# Patient Record
Sex: Female | Born: 1990 | Race: Black or African American | Hispanic: No | Marital: Single | Smoking: Current every day smoker
Health system: Southern US, Community
[De-identification: ages and names within clinical notes are randomized; demographics above are authoritative.]

## PROBLEM LIST (undated history)

## (undated) ENCOUNTER — Inpatient Hospital Stay (HOSPITAL_COMMUNITY): Payer: Self-pay

## (undated) DIAGNOSIS — J45909 Unspecified asthma, uncomplicated: Secondary | ICD-10-CM

## (undated) DIAGNOSIS — D649 Anemia, unspecified: Secondary | ICD-10-CM

## (undated) HISTORY — PX: WISDOM TOOTH EXTRACTION: SHX21

---

## 2011-03-08 ENCOUNTER — Emergency Department (HOSPITAL_COMMUNITY): Payer: BC Managed Care – PPO

## 2011-03-08 ENCOUNTER — Emergency Department (HOSPITAL_COMMUNITY): Payer: No Typology Code available for payment source

## 2011-03-08 ENCOUNTER — Emergency Department (HOSPITAL_COMMUNITY)
Admission: EM | Admit: 2011-03-08 | Discharge: 2011-03-08 | Disposition: A | Discharge status: Home / Self Care | Payer: BC Managed Care – PPO | Attending: Emergency Medicine | Admitting: Emergency Medicine

## 2011-03-08 ENCOUNTER — Emergency Department (HOSPITAL_COMMUNITY)
Admission: EM | Admit: 2011-03-08 | Discharge: 2011-03-09 | Disposition: A | Discharge status: Home / Self Care | Payer: No Typology Code available for payment source | Attending: Emergency Medicine | Admitting: Emergency Medicine

## 2011-03-08 DIAGNOSIS — R509 Fever, unspecified: Secondary | ICD-10-CM | POA: Insufficient documentation

## 2011-03-08 DIAGNOSIS — R079 Chest pain, unspecified: Secondary | ICD-10-CM | POA: Insufficient documentation

## 2011-03-08 DIAGNOSIS — M542 Cervicalgia: Secondary | ICD-10-CM | POA: Insufficient documentation

## 2011-03-08 DIAGNOSIS — R072 Precordial pain: Secondary | ICD-10-CM | POA: Insufficient documentation

## 2011-03-08 DIAGNOSIS — R630 Anorexia: Secondary | ICD-10-CM | POA: Insufficient documentation

## 2011-03-08 DIAGNOSIS — S139XXA Sprain of joints and ligaments of unspecified parts of neck, initial encounter: Secondary | ICD-10-CM | POA: Insufficient documentation

## 2011-03-08 DIAGNOSIS — J45909 Unspecified asthma, uncomplicated: Secondary | ICD-10-CM | POA: Insufficient documentation

## 2011-03-08 DIAGNOSIS — R197 Diarrhea, unspecified: Secondary | ICD-10-CM | POA: Insufficient documentation

## 2011-03-08 DIAGNOSIS — R109 Unspecified abdominal pain: Secondary | ICD-10-CM | POA: Insufficient documentation

## 2011-03-08 DIAGNOSIS — R10819 Abdominal tenderness, unspecified site: Secondary | ICD-10-CM | POA: Insufficient documentation

## 2011-03-08 DIAGNOSIS — N12 Tubulo-interstitial nephritis, not specified as acute or chronic: Secondary | ICD-10-CM | POA: Insufficient documentation

## 2011-03-08 DIAGNOSIS — R110 Nausea: Secondary | ICD-10-CM | POA: Insufficient documentation

## 2011-03-08 LAB — DIFFERENTIAL
Basophils Relative: 0 % (ref 0–1)
Eosinophils Relative: 0 % (ref 0–5)
Lymphocytes Relative: 7 % — ABNORMAL LOW (ref 12–46)
Monocytes Absolute: 0.3 10*3/uL (ref 0.1–1.0)
Monocytes Relative: 6 % (ref 3–12)
Neutro Abs: 4.4 10*3/uL (ref 1.7–7.7)

## 2011-03-08 LAB — COMPREHENSIVE METABOLIC PANEL
BUN: 6 mg/dL (ref 6–23)
CO2: 22 mEq/L (ref 19–32)
Chloride: 110 mEq/L (ref 96–112)
Creatinine, Ser: 0.71 mg/dL (ref 0.4–1.2)
GFR calc non Af Amer: >60 mL/min (ref >60)
Glucose, Bld: 125 mg/dL — ABNORMAL HIGH (ref 70–99)
Total Bilirubin: 0.5 mg/dL (ref 0.3–1.2)

## 2011-03-08 LAB — CBC
HCT: 36.8 % (ref 36.0–46.0)
Hemoglobin: 12.5 g/dL (ref 12.0–15.0)
MCH: 28.2 pg (ref 26.0–34.0)
MCHC: 34 g/dL (ref 30.0–36.0)

## 2011-03-08 LAB — URINALYSIS, ROUTINE W REFLEX MICROSCOPIC
Hgb urine dipstick: NEGATIVE
Protein, ur: 30 mg/dL — AB
Urobilinogen, UA: 1 mg/dL (ref 0.0–1.0)

## 2011-03-08 LAB — URINE MICROSCOPIC-ADD ON

## 2011-03-08 LAB — LIPASE, BLOOD: Lipase: 18 U/L (ref 11–59)

## 2011-03-08 LAB — POCT I-STAT, CHEM 8
Calcium, Ion: 1.11 mmol/L — ABNORMAL LOW (ref 1.12–1.32)
Creatinine, Ser: 0.8 mg/dL (ref 0.4–1.2)
Glucose, Bld: 125 mg/dL — ABNORMAL HIGH (ref 70–99)
Hemoglobin: 13.3 g/dL (ref 12.0–15.0)
Potassium: 3.3 mEq/L — ABNORMAL LOW (ref 3.5–5.1)

## 2011-03-09 ENCOUNTER — Emergency Department (HOSPITAL_COMMUNITY): Payer: No Typology Code available for payment source

## 2013-03-27 ENCOUNTER — Other Ambulatory Visit: Payer: Self-pay

## 2013-03-27 DIAGNOSIS — S93326A Dislocation of tarsometatarsal joint of unspecified foot, initial encounter: Secondary | ICD-10-CM

## 2013-03-27 DIAGNOSIS — M8448XA Pathological fracture, other site, initial encounter for fracture: Secondary | ICD-10-CM

## 2013-03-28 ENCOUNTER — Ambulatory Visit
Admission: RE | Admit: 2013-03-28 | Discharge: 2013-03-28 | Disposition: A | Discharge status: Home / Self Care | Payer: BC Managed Care – PPO | Source: Ambulatory Visit

## 2013-03-28 DIAGNOSIS — M8448XA Pathological fracture, other site, initial encounter for fracture: Secondary | ICD-10-CM

## 2013-03-28 DIAGNOSIS — S93326A Dislocation of tarsometatarsal joint of unspecified foot, initial encounter: Secondary | ICD-10-CM

## 2013-04-04 ENCOUNTER — Encounter: Payer: Self-pay | Admitting: Obstetrics and Gynecology

## 2013-09-09 ENCOUNTER — Encounter (HOSPITAL_COMMUNITY): Payer: Self-pay | Admitting: *Deleted

## 2013-09-09 ENCOUNTER — Emergency Department (HOSPITAL_COMMUNITY)
Admission: EM | Admit: 2013-09-09 | Discharge: 2013-09-09 | Disposition: A | Discharge status: Home / Self Care | Payer: No Typology Code available for payment source | Attending: Emergency Medicine | Admitting: Emergency Medicine

## 2013-09-09 ENCOUNTER — Emergency Department (HOSPITAL_COMMUNITY): Payer: BC Managed Care – PPO

## 2013-09-09 DIAGNOSIS — Z880 Allergy status to penicillin: Secondary | ICD-10-CM | POA: Insufficient documentation

## 2013-09-09 DIAGNOSIS — F17200 Nicotine dependence, unspecified, uncomplicated: Secondary | ICD-10-CM | POA: Insufficient documentation

## 2013-09-09 DIAGNOSIS — R6889 Other general symptoms and signs: Secondary | ICD-10-CM | POA: Insufficient documentation

## 2013-09-09 DIAGNOSIS — J45909 Unspecified asthma, uncomplicated: Secondary | ICD-10-CM | POA: Insufficient documentation

## 2013-09-09 DIAGNOSIS — Z791 Long term (current) use of non-steroidal anti-inflammatories (NSAID): Secondary | ICD-10-CM | POA: Insufficient documentation

## 2013-09-09 DIAGNOSIS — R5381 Other malaise: Secondary | ICD-10-CM | POA: Insufficient documentation

## 2013-09-09 DIAGNOSIS — M940 Chondrocostal junction syndrome [Tietze]: Secondary | ICD-10-CM | POA: Insufficient documentation

## 2013-09-09 DIAGNOSIS — R5383 Other fatigue: Secondary | ICD-10-CM

## 2013-09-09 HISTORY — DX: Unspecified asthma, uncomplicated: J45.909

## 2013-09-09 MED ORDER — NAPROXEN 500 MG PO TABS: 500.0000 mg | ORAL_TABLET | Freq: Two times a day (BID) | ORAL | Status: DC

## 2013-09-09 MED ORDER — NAPROXEN 250 MG PO TABS
500.0000 mg | ORAL_TABLET | Freq: Once | ORAL | Status: AC
  Administered 2013-09-09: 500 mg via ORAL
  Filled 2013-09-09: qty 2

## 2013-09-09 NOTE — ED Provider Notes (Signed)
CSN: [id number]     Arrival date & time 09/09/13  0255 History   First MD Initiated Contact with Patient 09/09/13 [phone]     Chief Complaint  Patient presents with  . chest and throat tightness    (Consider location/radiation/quality/duration/timing/severity/associated sxs/prior Treatment) HPI 22 year old female presents to emergency department with complaint of chest tightness and anterior chest pain, throat, tightness tonight, and generalized fatigue.  He reports fatigue, ongoing for the last week.  She reports that she works third shift, and is sleeping well during the day, but when she tries to stay awake at night.  She feels very fatigued.  Around 1:30 AM she had tightness in her throat.  She's had this in the past, and thought it maybe do to a food allergy.  She took 2 Benadryl, which has made an upper tightness, better.  She reports the chest tightness, and anterior chest pain.  Has not improved.  She denies any trauma or new activities that may trigger the pain.  No leg swelling, shortness of breath, pleuritic type chest pain, prolonged immobilization or birth control use. Past Medical History  Diagnosis Date  . Asthma    History reviewed. No pertinent past surgical history. No family history on file. History  Substance Use Topics  . Smoking status: Current Every Day Smoker  . Smokeless tobacco: Not on file  . Alcohol Use: Yes   OB History   Grav Para Term Preterm Abortions TAB SAB Ect Mult Living                 Review of Systems  All other systems reviewed and are negative.    Allergies  Penicillins  Home Medications   Current Outpatient Rx  Name  Route  Sig  Dispense  Refill  . naproxen (NAPROSYN) 500 MG tablet   Oral   Take 1 tablet (500 mg total) by mouth 2 (two) times daily with a meal.   30 tablet   0    BP 119/59  Pulse 81  Temp(Src) 98.2 F (36.8 C) (Oral)  Resp 16  SpO2 100% Physical Exam  Nursing note and vitals reviewed. Constitutional: She is  oriented to person, place, and time. She appears well-developed and well-nourished.  HENT:  Head: Normocephalic and atraumatic.  Right Ear: External ear normal.  Left Ear: External ear normal.  Nose: Nose normal.  Mouth/Throat: Oropharynx is clear and moist.  Eyes: Conjunctivae and EOM are normal. Pupils are equal, round, and reactive to light.  Neck: Normal range of motion. Neck supple. No JVD present. No tracheal deviation present. No thyromegaly present.  Cardiovascular: Normal rate, regular rhythm, normal heart sounds and intact distal pulses.  Exam reveals no gallop and no friction rub.   No murmur heard. Pulmonary/Chest: Effort normal and breath sounds normal. No stridor. No respiratory distress. She has no wheezes. She has no rales. She exhibits tenderness (patient with tenderness to her anterior sternum reproduces her pain).  Abdominal: Soft. Bowel sounds are normal. She exhibits no distension and no mass. There is no tenderness. There is no rebound and no guarding.  Musculoskeletal: Normal range of motion. She exhibits no edema and no tenderness.  Lymphadenopathy:    She has no cervical adenopathy.  Neurological: She is alert and oriented to person, place, and time. She has normal reflexes. No cranial nerve deficit. She exhibits normal muscle tone. Coordination normal.  Skin: Skin is warm and dry. No rash noted. No erythema. No pallor.  Psychiatric: She has a  normal mood and affect. Her behavior is normal. Judgment and thought content normal.    ED Course  Procedures (including critical care time) Labs Review Labs Reviewed - No data to display Imaging Review Dg Chest 2 View  09/09/2013   CLINICAL DATA:  Chest tightness  EXAM: CHEST  2 VIEW  COMPARISON:  03/08/2011  FINDINGS: The heart size and mediastinal contours are within normal limits. Both lungs are clear. The visualized skeletal structures are unremarkable. No pneumothorax.  IMPRESSION: No active cardiopulmonary disease.    Electronically Signed   By: Oley Balm M.D.   On: 09/09/2013 04:49    Date: 09/09/2013  Rate: 77  Rhythm: normal sinus rhythm  QRS Axis: normal  Intervals: normal  ST/T Wave abnormalities: normal  Conduction Disutrbances:none  Narrative Interpretation:   Old EKG Reviewed: unchanged    MDM   1. Costochondritis, acute   2. Fatigue    86-year-old female with anterior chest wall pain, throat, tightness, and fatigue.  She reports family history of hypothyroidism, but has not been diagnosed herself.  Workup here shows probable costochondritis.  She's been encouraged to find a local primary care Dr. for further workup of her ongoing fatigue.    Olivia Mackie, MD 09/09/13 [phone]

## 2013-09-09 NOTE — ED Notes (Signed)
Patient transported to X-ray 

## 2013-09-09 NOTE — ED Notes (Signed)
The pt is c/o chest and throat tightness since 0130a.  She has a history of the same,  She took a benadryl and the sensation may have  Become better

## 2014-02-04 ENCOUNTER — Encounter (HOSPITAL_BASED_OUTPATIENT_CLINIC_OR_DEPARTMENT_OTHER): Payer: Self-pay | Admitting: Emergency Medicine

## 2014-02-04 ENCOUNTER — Emergency Department (HOSPITAL_BASED_OUTPATIENT_CLINIC_OR_DEPARTMENT_OTHER)
Admission: EM | Admit: 2014-02-04 | Discharge: 2014-02-04 | Disposition: A | Discharge status: Home / Self Care | Payer: No Typology Code available for payment source | Attending: Emergency Medicine | Admitting: Emergency Medicine

## 2014-02-04 DIAGNOSIS — Z791 Long term (current) use of non-steroidal anti-inflammatories (NSAID): Secondary | ICD-10-CM | POA: Insufficient documentation

## 2014-02-04 DIAGNOSIS — Z880 Allergy status to penicillin: Secondary | ICD-10-CM | POA: Insufficient documentation

## 2014-02-04 DIAGNOSIS — J45909 Unspecified asthma, uncomplicated: Secondary | ICD-10-CM | POA: Insufficient documentation

## 2014-02-04 DIAGNOSIS — F17200 Nicotine dependence, unspecified, uncomplicated: Secondary | ICD-10-CM | POA: Insufficient documentation

## 2014-02-04 DIAGNOSIS — J039 Acute tonsillitis, unspecified: Secondary | ICD-10-CM | POA: Insufficient documentation

## 2014-02-04 LAB — RAPID STREP SCREEN (MED CTR MEBANE ONLY): STREPTOCOCCUS, GROUP A SCREEN (DIRECT): NEGATIVE

## 2014-02-04 MED ORDER — FLUCONAZOLE 150 MG PO TABS: 150.0000 mg | ORAL_TABLET | Freq: Every day | ORAL | Status: DC

## 2014-02-04 MED ORDER — CLINDAMYCIN HCL 300 MG PO CAPS: 300.0000 mg | ORAL_CAPSULE | Freq: Four times a day (QID) | ORAL | Status: DC

## 2014-02-04 NOTE — ED Provider Notes (Addendum)
CSN: [id number]     Arrival date & time 02/04/14  1411 History   First MD Initiated Contact with Patient 02/04/14 1440     Chief Complaint  Patient presents with  . Sore Throat     (Consider location/radiation/quality/duration/timing/severity/associated sxs/prior Treatment) HPI Comments: Patient is a 23 year old female presents with sore throat for the past 2 days. She noticed some white spots in her throat and this concerned her. She denies any ill contacts.  Patient is a 23 y.o. female presenting with pharyngitis. The history is provided by the patient.  Sore Throat This is a new problem. The current episode started 2 days ago. The problem occurs constantly. The problem has been gradually worsening. Pertinent negatives include no abdominal pain, no headaches and no shortness of breath. The symptoms are aggravated by swallowing. Nothing relieves the symptoms. She has tried nothing for the symptoms. The treatment provided no relief.    Past Medical History  Diagnosis Date  . Asthma    History reviewed. No pertinent past surgical history. No family history on file. History  Substance Use Topics  . Smoking status: Current Every Day Smoker  . Smokeless tobacco: Not on file  . Alcohol Use: Yes   OB History   Grav Para Term Preterm Abortions TAB SAB Ect Mult Living                 Review of Systems  Respiratory: Negative for shortness of breath.   Gastrointestinal: Negative for abdominal pain.  Neurological: Negative for headaches.  All other systems reviewed and are negative.      Allergies  Penicillins and Sulfa antibiotics  Home Medications   Current Outpatient Rx  Name  Route  Sig  Dispense  Refill  . naproxen (NAPROSYN) 500 MG tablet   Oral   Take 1 tablet (500 mg total) by mouth 2 (two) times daily with a meal.   30 tablet   0    BP 113/69  Pulse 100  Temp(Src) 100.8 F (38.2 C) (Oral)  Resp 18  Ht 5\' 8"  (1.727 m)  Wt 180 lb (81.647 kg)  BMI 27.38  kg/m2  SpO2 100% Physical Exam  Nursing note and vitals reviewed. Constitutional: She is oriented to person, place, and time. She appears well-developed and well-nourished. No distress.  HENT:  Head: Normocephalic and atraumatic.  Mouth/Throat: Oropharynx is clear and moist.  The posterior oropharynx is erythematous with exudates present. There is no asymmetry or tonsillar deviation.  Neck: Normal range of motion. Neck supple.  Cardiovascular: Normal rate and regular rhythm.   Pulmonary/Chest: Effort normal and breath sounds normal. No respiratory distress.  Musculoskeletal: Normal range of motion. She exhibits no edema.  Lymphadenopathy:    She has no cervical adenopathy.  Neurological: She is alert and oriented to person, place, and time.  Skin: Skin is warm and dry. She is not diaphoretic.    ED Course  Procedures (including critical care time) Labs Review Labs Reviewed  RAPID STREP SCREEN   Imaging Review No results found.    MDM   Final diagnoses:  None    Strep test negative.  Will treat for tonsillitis with clindamycin.  Return prn.    Geoffery Lyonsouglas Amro Winebarger, MD 02/04/14 1511  Geoffery Lyonsouglas Erial Fikes, MD 02/04/14 [phone]

## 2014-02-04 NOTE — ED Notes (Signed)
Pt. Reports she has a sore throat and cough.  Pt. Reports aching all over.

## 2014-02-04 NOTE — Discharge Instructions (Signed)
Clindamycin as prescribed.    Return to the ER for difficulty breathing or swallowing.     Tonsillitis Tonsillitis is an infection of the throat that causes the tonsils to become red, tender, and swollen. Tonsils are collections of lymphoid tissue at the back of the throat. Each tonsil has crevices (crypts). Tonsils help fight nose and throat infections and keep infection from spreading to other parts of the body for the first 18 months of life.  CAUSES Sudden (acute) tonsillitis is usually caused by infection with streptococcal bacteria. Long-lasting (chronic) tonsillitis occurs when the crypts of the tonsils become filled with pieces of food and bacteria, which makes it easy for the tonsils to become repeatedly infected. SYMPTOMS  Symptoms of tonsillitis include:  A sore throat, with possible difficulty swallowing.  White patches on the tonsils.  Fever.  Tiredness.  New episodes of snoring during sleep, when you did not snore before.  Small, foul-smelling, yellowish-white pieces of material (tonsilloliths) that you occasionally cough up or spit out. The tonsilloliths can also cause you to have bad breath. DIAGNOSIS Tonsillitis can be diagnosed through a physical exam. Diagnosis can be confirmed with the results of lab tests, including a throat culture. TREATMENT  The goals of tonsillitis treatment include the reduction of the severity and duration of symptoms and prevention of associated conditions. Symptoms of tonsillitis can be improved with the use of steroids to reduce the swelling. Tonsillitis caused by bacteria can be treated with antibiotics. Usually, treatment with antibiotics is started before the cause of the tonsillitis is known. However, if it is determined that the cause is not bacterial, antibiotics will not treat the tonsillitis. If attacks of tonsillitis are severe and frequent, your caregiver may recommend surgery to remove the tonsils (tonsillectomy). HOME CARE  INSTRUCTIONS   Rest as much as possible and get plenty of sleep.  Drink plenty of fluids. While the throat is very sore, eat soft foods or liquids, such as sherbet, soups, or instant breakfast drinks.  Eat frozen ice pops.  Gargle with a warm or cold liquid to help soothe the throat. Mix 1/4 teaspoon of salt and 1/4 teaspoon of baking soda in in 8 oz of water. SEEK MEDICAL CARE IF:   Large, tender lumps develop in your neck.  A rash develops.  A green, yellow-brown, or bloody substance is coughed up.  You are unable to swallow liquids or food for 24 hours.  You notice that only one of the tonsils is swollen. SEEK IMMEDIATE MEDICAL CARE IF:   You develop any new symptoms such as vomiting, severe headache, stiff neck, chest pain, or trouble breathing or swallowing.  You have severe throat pain along with drooling or voice changes.  You have severe pain, unrelieved with recommended medications.  You are unable to fully open the mouth.  You develop redness, swelling, or severe pain anywhere in the neck.  You have a fever. MAKE SURE YOU:   Understand these instructions.  Will watch your condition.  Will get help right away if you are not doing well or get worse. Document Released: 09/15/2005 Document Revised: 08/08/2013 Document Reviewed: 05/25/2013 [facility]Exit[facility] Patient Information 2014 [place]Care, [place].

## 2014-02-06 LAB — CULTURE, GROUP A STREP

## 2015-10-22 ENCOUNTER — Encounter (HOSPITAL_COMMUNITY): Payer: Self-pay | Admitting: Emergency Medicine

## 2015-10-22 ENCOUNTER — Emergency Department (HOSPITAL_COMMUNITY): Payer: No Typology Code available for payment source

## 2015-10-22 ENCOUNTER — Emergency Department (HOSPITAL_COMMUNITY)
Admission: EM | Admit: 2015-10-22 | Discharge: 2015-10-22 | Disposition: A | Discharge status: Home / Self Care | Payer: No Typology Code available for payment source | Attending: Emergency Medicine | Admitting: Emergency Medicine

## 2015-10-22 ENCOUNTER — Ambulatory Visit: Payer: Self-pay

## 2015-10-22 DIAGNOSIS — Z79899 Other long term (current) drug therapy: Secondary | ICD-10-CM | POA: Insufficient documentation

## 2015-10-22 DIAGNOSIS — J45909 Unspecified asthma, uncomplicated: Secondary | ICD-10-CM | POA: Insufficient documentation

## 2015-10-22 DIAGNOSIS — N390 Urinary tract infection, site not specified: Secondary | ICD-10-CM | POA: Insufficient documentation

## 2015-10-22 DIAGNOSIS — Z720 Tobacco use: Secondary | ICD-10-CM | POA: Insufficient documentation

## 2015-10-22 DIAGNOSIS — Z113 Encounter for screening for infections with a predominantly sexual mode of transmission: Secondary | ICD-10-CM | POA: Insufficient documentation

## 2015-10-22 DIAGNOSIS — Z880 Allergy status to penicillin: Secondary | ICD-10-CM | POA: Insufficient documentation

## 2015-10-22 DIAGNOSIS — R05 Cough: Secondary | ICD-10-CM | POA: Insufficient documentation

## 2015-10-22 LAB — URINALYSIS, ROUTINE W REFLEX MICROSCOPIC
Bilirubin Urine: NEGATIVE
Glucose, UA: NEGATIVE mg/dL
Ketones, ur: NEGATIVE mg/dL
Nitrite: POSITIVE — AB
Protein, ur: NEGATIVE mg/dL
Specific Gravity, Urine: 1.02 (ref 1.005–1.030)
Urobilinogen, UA: 0.2 mg/dL (ref 0.0–1.0)
pH: 7.5 (ref 5.0–8.0)

## 2015-10-22 LAB — WET PREP, GENITAL
Clue Cells Wet Prep HPF POC: NONE SEEN
Trich, Wet Prep: NONE SEEN
Yeast Wet Prep HPF POC: NONE SEEN

## 2015-10-22 LAB — URINE MICROSCOPIC-ADD ON

## 2015-10-22 MED ORDER — KETOROLAC TROMETHAMINE 15 MG/ML IJ SOLN
15.0000 mg | Freq: Once | INTRAMUSCULAR | Status: AC
  Administered 2015-10-22: 15 mg via INTRAVENOUS
  Filled 2015-10-22: qty 1

## 2015-10-22 MED ORDER — GENTAMICIN SULFATE 40 MG/ML IJ SOLN
240.0000 mg | Freq: Once | INTRAMUSCULAR | Status: AC
  Administered 2015-10-22: 240 mg via INTRAMUSCULAR
  Filled 2015-10-22: qty 6

## 2015-10-22 MED ORDER — CIPROFLOXACIN HCL 500 MG PO TABS
500.0000 mg | ORAL_TABLET | Freq: Once | ORAL | Status: AC
  Administered 2015-10-22: 500 mg via ORAL
  Filled 2015-10-22: qty 1

## 2015-10-22 MED ORDER — AZITHROMYCIN 250 MG PO TABS
2000.0000 mg | ORAL_TABLET | Freq: Once | ORAL | Status: AC
  Administered 2015-10-22: 2000 mg via ORAL
  Filled 2015-10-22: qty 8

## 2015-10-22 MED ORDER — CIPROFLOXACIN HCL 500 MG PO TABS: 500.0000 mg | ORAL_TABLET | Freq: Two times a day (BID) | ORAL | Status: DC

## 2015-10-22 NOTE — ED Notes (Signed)
Pt states that she has had dysuria x 2 weeks with malodorous urine. Taking AZO at home. Also reports cough with chest congestion. Alert and oriented.

## 2015-10-22 NOTE — Discharge Instructions (Signed)
Urinary Tract Infection °Urinary tract infections (UTIs) can develop anywhere along your urinary tract. Your urinary tract is your body's drainage system for removing wastes and extra water. Your urinary tract includes two kidneys, two ureters, a bladder, and a urethra. Your kidneys are a pair of bean-shaped organs. Each kidney is about the size of your fist. They are located below your ribs, one on each side of your spine. °CAUSES °Infections are caused by microbes, which are microscopic organisms, including fungi, viruses, and bacteria. These organisms are so small that they can only be seen through a microscope. Bacteria are the microbes that most commonly cause UTIs. °SYMPTOMS  °Symptoms of UTIs may vary by age and gender of the patient and by the location of the infection. Symptoms in young women typically include a frequent and intense urge to urinate and a painful, burning feeling in the bladder or urethra during urination. Older women and men are more likely to be tired, shaky, and weak and have muscle aches and abdominal pain. A fever may mean the infection is in your kidneys. Other symptoms of a kidney infection include pain in your back or sides below the ribs, nausea, and vomiting. °DIAGNOSIS °To diagnose a UTI, your caregiver will ask you about your symptoms. Your caregiver will also ask you to provide a urine sample. The urine sample will be tested for bacteria and white blood cells. White blood cells are made by your body to help fight infection. °TREATMENT  °Typically, UTIs can be treated with medication. Because most UTIs are caused by a bacterial infection, they usually can be treated with the use of antibiotics. The choice of antibiotic and length of treatment depend on your symptoms and the type of bacteria causing your infection. °HOME CARE INSTRUCTIONS °· If you were prescribed antibiotics, take them exactly as your caregiver instructs you. Finish the medication even if you feel better after  you have only taken some of the medication. °· Drink enough water and fluids to keep your urine clear or pale yellow. °· Avoid caffeine, tea, and carbonated beverages. They tend to irritate your bladder. °· Empty your bladder often. Avoid holding urine for long periods of time. °· Empty your bladder before and after sexual intercourse. °· After a bowel movement, women should cleanse from front to back. Use each tissue only once. °SEEK MEDICAL CARE IF:  °· You have back pain. °· You develop a fever. °· Your symptoms do not begin to resolve within 3 days. °SEEK IMMEDIATE MEDICAL CARE IF:  °· You have severe back pain or lower abdominal pain. °· You develop chills. °· You have nausea or vomiting. °· You have continued burning or discomfort with urination. °MAKE SURE YOU:  °· Understand these instructions. °· Will watch your condition. °· Will get help right away if you are not doing well or get worse. °  °This information is not intended to replace advice given to you by your health care provider. Make sure you discuss any questions you have with your health care provider. °  °Document Released: 09/15/2005 Document Revised: 08/27/2015 Document Reviewed: 01/14/2012 °Elsevier Interactive Patient Education ©2016 Elsevier Inc. ° °Emergency Department Resource Guide °1) Find a Doctor and Pay Out of Pocket °Although you won't have to find out who is covered by your insurance plan, it is a good idea to ask around and get recommendations. You will then need to call the office and see if the doctor you have chosen will accept you as a new   patient and what types of options they offer for patients who are self-pay. Some doctors offer discounts or will set up payment plans for their patients who do not have insurance, but you will need to ask so you aren't surprised when you get to your appointment. ° °2) Contact Your Local Health Department °Not all health departments have doctors that can see patients for sick visits, but many  do, so it is worth a call to see if yours does. If you don't know where your local health department is, you can check in your phone book. The CDC also has a tool to help you locate your state's health department, and many state websites also have listings of all of their local health departments. ° °3) Find a Walk-in Clinic °If your illness is not likely to be very severe or complicated, you may want to try a walk in clinic. These are popping up all over the country in pharmacies, drugstores, and shopping centers. They're usually staffed by nurse practitioners or physician assistants that have been trained to treat common illnesses and complaints. They're usually fairly quick and inexpensive. However, if you have serious medical issues or chronic medical problems, these are probably not your best option. ° °No Primary Care Doctor: °- Call [facility] at  832-8000 - they can help you locate a primary care doctor that  accepts your insurance, provides certain services, etc. °- Physician Referral Service- 1-800-533-3463 ° °Chronic Pain Problems: °Organization         Address  Phone   Notes  °[facility]  (336) 297-2271 Patients need to be referred by their primary care doctor.  ° °Medication Assistance: °Organization         Address  Phone   Notes  °[place] [facility] [address] °[place], [place] [address] (336) 641-8030 --Must be a resident of [place] °-- Must have NO insurance coverage whatsoever (no Medicaid/ Medicare, etc.) °-- The pt. MUST have a primary care doctor that directs their care regularly and follows them in the community °  °[facility]  (866) 331-1348   °[employer]  (888) 892-1162   ° °Agencies that provide inexpensive medical care: °Organization         Address  Phone   Notes  °[facility]  (336) 832-8035   °[facility]    (336) 832-7272   °[facility] [address] °[place], [place] [address] (336) 832-4777   °[facility] [address], °[place] (336) 271-4999   °[facility]    (336) 373-0678   °[facility]    (336) 272-1050   °[facility] ° [address], [place] Phone:  (336) 832-4444, Fax:  (336) 832-4440 Hours of Operation:  9 am - 6 pm, M-F.  Also accepts Medicaid/Medicare and self-pay.  °[facility] ° [address], [place] Phone: (336) 832-3150, Fax: (336) 832-3151. Hours of Operation:  8:30 am - 5:30 pm, M-F.  Also accepts Medicaid and self-pay.  °[facility] [place] [address], [place] Phone: (336) 878-6027   °[facility] [address], [place], [place] (336)723-1848, Ext. 123 Mondays & Thursdays: 7-9 AM.  First 15 patients are seen on a first come, first serve basis. °  ° °Medicaid-accepting [place] Providers: ° °Organization         Address    Phone   Notes  °[facility] [address], [place] (336) 641-2100 Also accepts self-pay patients.  °[facility] [address], [place] ° (336) 856-9996   °[facility] [address] [place] [address], [place] (336) 288-8857   °[facility] [address] [place] [address], [place] (336) 299-7000   °Veita Bland [address], [place]  ° (336) 373-1557 Only accepts [place] Access Medicaid patients after they have their name applied to their card.  ° °Self-Pay (no insurance) in [place]: ° °Organization         Address  Phone   Notes  °Sickle Cell Patients, [facility] [address], [place] (336) 832-1970   °[facility] [address], [place] (336) 832-4400   °[facility] ° [address], [place] (336) 992-4800   °[facility]/Dr. Osei-Bonsu ° [address] [place] [address], [place] or  [address], [place] (336) 841-8500 Phone number for both [place] and [place] locations is the same.  °[facility] [address], [place] (336) 299-0000   °[facility] [address] [place] [address], [place] or [address] (336) 852-7530 °(336) 878-2260   °[facility] [address], [place] (336) 350-1642, phone; (336) 294-5005, fax Sees patients 1st and 3rd Saturday of every month.  Must not qualify for public or private insurance (i.e. Medicaid, Medicare, [place] Health Choice, Veterans' Benefits) • Household income should be no more than 200% of the poverty level •The clinic cannot treat you if you are pregnant or think you are pregnant • Sexually transmitted diseases are not treated at the clinic.  ° ° °Dental Care: °Organization         Address  Phone  Notes  °[facility] [facility] [address], [place] (336) 641-6152 Accepts children up to age 21 who are enrolled in Medicaid or [place] Health Choice; pregnant women with a Medicaid card; and children who have applied for Medicaid or [place] Health Choice, but were declined, whose parents can pay a reduced fee at time of service.  °[facility] [place]  [address], [place] (336) 641-7733 Accepts children up to age 21 who are enrolled in Medicaid or [place] Health Choice; pregnant women with a Medicaid card; and children who have applied for Medicaid or [place] Health Choice, but were declined, whose parents can pay a reduced fee at time of service.  °[facility] ° [address],  (336) 641-4533 Patients are seen by appointment only. Walk-ins are not accepted. [facility] will see patients 18 years of age and older. °Monday - Tuesday (8am-5pm) °Most Wednesdays (8:30-5pm) °$30 per visit, cash only  °[facility] ° [address], [place] (336) 641-4533 Patients are seen by appointment only. Walk-ins are not accepted. [facility] will see patients 18 years of age and older. °One Wednesday Evening (Monthly: Volunteer Based).  $30 per visit, cash only  °[facility]  (919) 537-3737 for adults; Children under age 4, call Graduate Pediatric Dentistry at (919) [phone]. Children aged 4-14, please call (919) 537-3737 to request a pediatric application. ° Dental services are provided in all areas of dental care including fillings, crowns   and bridges, complete and partial dentures, implants, gum treatment, root canals, and extractions. Preventive care is also provided. Treatment is provided to both adults and children. °Patients are selected via a lottery and there is often a waiting list. °  °[facility] [address], °[place] ° (336) 763-8833 www.drcivils.com °  °[facility] [address], [place], [place] (336)723-1848, Ext. 123 Second and Fourth Thursday of each month, opens at 6:30 AM; Clinic ends at 9 AM.  Patients are seen on a first-come first-served basis, and a limited number are seen during each clinic.  ° °[facility] ° [address], [place], [place] (336) 723-7904   Eligibility Requirements °You must have lived in [place], or [place] counties for at least the last three months. °  You cannot be eligible for state or federal sponsored healthcare insurance, including Veterans Administration, Medicaid, or Medicare. °  You generally cannot be eligible for healthcare insurance through your employer.  °  How to apply: °Eligibility screenings are held every Tuesday and Wednesday afternoon from 1:00 pm until 4:00 pm. You do not need an appointment for the interview!  °[facility] [address], [place], [place] 336-631-2330   °[facility] [facility]  336-342-8273   °[place]  336-703-3100   °[place] [facility]  336-570-6415   ° °Behavioral Health Resources in the Community: °Intensive Outpatient Programs °Organization         Address  Phone  Notes  °[facility] [address], [place], [place] 336-878-6098   °[facility] [address], [place], [place] 336-832-9800   °[facility] [address], [place], [place] ° 336-882-2125   °[facility] [address],  °[place], [place] 1-800-853-5163 or 336-641-4981   °Substance Abuse Resources °Organization         Address  Phone  Notes  °[facility]  336-882-2125   °[facility]  336-784-9470   °[facility]  336-285-9073   °[facility]  336-845-3988   °Residential & Outpatient Substance Abuse Program  1-800-659-3381   °Psychological Services °Organization         Address  Phone  Notes  °[facility]  336- 832-9600   °[facility]  336- 378-7881   °[facility] [address], [place] 1-800-853-5163 or 336-641-4981   ° °Mobile Crisis Teams °Organization         Address  Phone  Notes  °[facility], Mobile Crisis Care Unit  1-877-626-1772   °Assertive °[facility] ° [address] [place], [place] 336-834-9664   °Sharon DeEsch [address] °[place] 336-554-5454   ° °Self-Help/Support Groups °Organization         Address  Phone             Notes  °[facility] - variety of support groups  336- 373-1402 Call for more information  °Narcotics Anonymous (NA), [facility] [address], °[place]  2 meetings at this location  ° °Residential Treatment Programs °Organization         Address  Phone  Notes  °[facility] [address],    °[place]  1-866-801-8205   °[facility] ° [address], [place], [place] 704-293-8524   °[facility] [address], [place] 336-845-3988    Admissions: 8am-3pm M-F  °[facility] [address]    °[place], [place] 336-841-1104   °[facility] [address], [place], [place] 336-379-7146   °[facility] [address]  °[place], [place] 336-285-9073   °[facility] Intensive Outpatient [address], [place], [place] 336-852-3033   °[facility] [address]  °[place], [place] 1-877-615-2722 or 336-784-9470   °[facility] [address] [place], [place] 336-227-7417 Accepts Medicaid  °[facility] [address]  °[place] 1-800-659-3381 Substance Abuse/Addiction Treatment  ° °[place] Behavioral Health Resources °Organization         Address  Phone  Notes  °[facility]  (888) 581-9988   °Julie Brannon, PhD [address] [place], [place]   (336) 349-5553 or (336) 951-0000   °[facility]   [address] °[place], [place] (336) 349-4454   °[facility] [address], [place], [place] (336) 342-8316 Insurance/Medicaid/sponsorship through [facility]  °[facility] [address]                                    [place], [place] (336) 342-8316 Therapy/tele-psych/case  °[facility] [address]  ° [place], [place] (336) 349-2233    °Dr. Arfeen  (336) 349-4544   °[facility] [place]  [employer] [place] [facility] 1) [address], [place] °2) [address], [place] °3)  [address] [place] [address], [place] (336) 349-3220 °(336) 342-7768 ° °(336) 342-8140   °[place] Child Abuse [facility] (336) 342-1394 or (336) 342-3537 (After Hours)    ° ° ° °

## 2015-10-22 NOTE — ED Notes (Signed)
Patient transported to X-ray 

## 2015-10-22 NOTE — Progress Notes (Addendum)
EDCM spoke to patient at bedside. Patient confirms she does not have a pcp or insurance [place].  [facility], informed patient of services there.  EDCM also provided patient with list of pcps who accept self pay patients, list of discount pharmacies and websites needymeds.org and GoodRX.com for medication assistance, phone number to inquire about the orange card, phone number to inquire about Mediciad, phone number to inquire about the Affordable Care Act, financial resources in the community such as local churches, salvation army, urban ministries, and dental assistance for uninsured patients.  Patient thankful for resources. Discussed importance and purpose of having a pcp and that the ED is not to be used for primary care.  Patient verbalized understanding.  No further EDCM needs at this time.  Patient [facility]able [facility] for orange card.  Referral completed.

## 2015-10-23 LAB — RPR: RPR Ser Ql: NONREACTIVE

## 2015-10-24 LAB — GC/CHLAMYDIA PROBE AMP (~~LOC~~) NOT AT ARMC
Chlamydia: NEGATIVE
Neisseria Gonorrhea: NEGATIVE

## 2015-10-25 LAB — URINE CULTURE: Culture: >=100000

## 2015-10-27 ENCOUNTER — Telehealth (HOSPITAL_COMMUNITY): Payer: Self-pay

## 2015-10-27 NOTE — Telephone Encounter (Signed)
Post ED Visit - Positive Culture Follow-up  Culture report reviewed by antimicrobial stewardship pharmacist:  []  Enzo BiNathan Batchelder, Pharm.D. []  Celedonio MiyamotoJeremy Frens, Pharm.D., BCPS []  Garvin FilaMike Maccia, Pharm.D. [x]  Georgina PillionElizabeth Martin, Pharm.D., BCPS []  GrayMinh Pham, [address]Ph[address]D., BCPS, AAHIVP []  Estella HuskMichelle Turner, Pharm.D., BCPS, AAHIVP []  Tennis Mustassie Stewart, Pharm.D. []  Sherle Poeob Vincent, [address]Pharm.D.  Positive urine culture, >/= 100,000 colonies -> E Coli  Treated with Ciprofloxacin, organism sensitive to the same and no further patient follow-up is required at this time.  Abigail Perry, Abigail Perry 10/27/2015, 4:46 AM

## 2015-11-03 NOTE — ED Provider Notes (Signed)
CSN: [id number]     Arrival date & time 10/22/15  1626 History   First MD Initiated Contact with Patient 10/22/15 1640     Chief Complaint  Patient presents with  . Dysuria  . Cough     (Consider location/radiation/quality/duration/timing/severity/associated sxs/prior Treatment) HPI   23yf with dysuria. Onset about two weeks ago. Initially pain only with urination but now constant lower abdominal "ache" for several days. Cloudy, strong smelling urine. No n/v. No unusual vaginal bleeding or discharge. No n/v. No fever or chills. Has been taking azo with no improvement.   Past Medical History  Diagnosis Date  . Asthma    History reviewed. No pertinent past surgical history. History reviewed. No pertinent family history. Social History  Substance Use Topics  . Smoking status: Current Every Day Smoker  . Smokeless tobacco: None  . Alcohol Use: Yes   OB History    No data available     Review of Systems  All systems reviewed and negative, other than as noted in HPI.   Allergies  Penicillins and Sulfa antibiotics  Home Medications   Prior to Admission medications   Medication Sig Start Date End Date Taking? Authorizing Provider  Aspirin-Salicylamide-Caffeine (BC HEADACHE POWDER PO) Take 1 packet by mouth 2 (two) times daily as needed (headache).   Yes Historical Provider, MD  ibuprofen (ADVIL,MOTRIN) 200 MG tablet Take 800 mg by mouth every 6 (six) hours as needed for headache or moderate pain.   Yes Historical Provider, MD  Multiple Vitamins-Minerals (MULTIVITAMIN & MINERAL PO) Take 1 tablet by mouth daily.   Yes Historical Provider, MD  ciprofloxacin (CIPRO) 500 MG tablet Take 1 tablet (500 mg total) by mouth every 12 (twelve) hours. 10/22/15   Raeford Razor, MD  clindamycin (CLEOCIN) 300 MG capsule Take 1 capsule (300 mg total) by mouth 4 (four) times daily. X 7 days Patient not taking: Reported on 10/22/2015 02/04/14   Geoffery Lyons, MD  fluconazole (DIFLUCAN) 150 MG tablet  Take 1 tablet (150 mg total) by mouth daily. Patient not taking: Reported on 10/22/2015 02/04/14   Geoffery Lyons, MD  naproxen (NAPROSYN) 500 MG tablet Take 1 tablet (500 mg total) by mouth 2 (two) times daily with a meal. Patient not taking: Reported on 10/22/2015 09/09/13   Marisa Severin, MD   BP 123/51 mmHg  Pulse 64  Temp(Src) 98.2 F (36.8 C) (Oral)  Resp 20  Ht  (1.549 m)  Wt 203 lb (92.08 kg)  BMI 38.38 kg/m2  SpO2 100%  LMP 10/05/2015 Physical Exam  Constitutional: She appears well-developed and well-nourished. No distress.  HENT:  Head: Normocephalic and atraumatic.  Eyes: Conjunctivae are normal. Right eye exhibits no discharge. Left eye exhibits no discharge.  Neck: Neck supple.  Cardiovascular: Normal rate, regular rhythm and normal heart sounds.  Exam reveals no gallop and no friction rub.   No murmur heard. Pulmonary/Chest: Effort normal and breath sounds normal. No respiratory distress.  Abdominal: Soft. She exhibits no distension. There is tenderness. There is no rebound and no guarding.  Mild suprapubic ttp  Genitourinary:  Chaperone present. Norma external genitalia. No concerning lesions noted. Scant whitish vaginal discharge. No cmt, cervical lesions or adnexal tenderness noted.   Musculoskeletal: She exhibits no edema or tenderness.  Neurological: She is alert.  Skin: Skin is warm and dry.  Psychiatric: She has a normal mood and affect. Her behavior is normal. Thought content normal.  Nursing note and vitals reviewed.   ED Course  Procedures (  including critical care time) Labs Review Labs Reviewed  WET PREP, GENITAL - Abnormal; Notable for the following:    WBC, Wet Prep HPF POC FEW (*)    All other components within normal limits  URINALYSIS, ROUTINE W REFLEX MICROSCOPIC (NOT AT [facility]) - Abnormal; Notable for the following:    APPearance CLOUDY (*)    Hgb urine dipstick TRACE (*)    Nitrite POSITIVE (*)    Leukocytes, UA LARGE (*)    All other  components within normal limits  URINE MICROSCOPIC-ADD ON - Abnormal; Notable for the following:    Squamous Epithelial / LPF FEW (*)    Bacteria, UA MANY (*)    All other components within normal limits  URINE CULTURE  RPR  GC/CHLAMYDIA PROBE AMP ([place]) NOT AT [facility]    Imaging Review No results found. I have personally reviewed and evaluated these images and lab results as part of my medical decision-making.   EKG Interpretation None      MDM   Final diagnoses:  UTI (lower urinary tract infection)  Screen for STD (sexually transmitted disease)    23yf with symptoms and ua consistent with uti. i feel appropriate for outpt tx. Requesting empiric tx for possible std. pcn allergy listed as anaphylaxis reason for gentamicin use.    Raeford RazorStephen Karl Erway, MD 11/03/15 1005

## 2017-03-21 ENCOUNTER — Inpatient Hospital Stay (HOSPITAL_COMMUNITY)
Admission: AD | Admit: 2017-03-21 | Discharge: 2017-03-21 | Disposition: A | Discharge status: Home / Self Care | Payer: Medicaid Other | Source: Ambulatory Visit | Attending: Obstetrics & Gynecology | Admitting: Obstetrics & Gynecology

## 2017-03-21 ENCOUNTER — Encounter (HOSPITAL_COMMUNITY): Payer: Self-pay | Admitting: *Deleted

## 2017-03-21 ENCOUNTER — Inpatient Hospital Stay (HOSPITAL_COMMUNITY): Payer: Medicaid Other

## 2017-03-21 DIAGNOSIS — B9689 Other specified bacterial agents as the cause of diseases classified elsewhere: Secondary | ICD-10-CM | POA: Insufficient documentation

## 2017-03-21 DIAGNOSIS — O99332 Smoking (tobacco) complicating pregnancy, second trimester: Secondary | ICD-10-CM | POA: Diagnosis not present

## 2017-03-21 DIAGNOSIS — Z7982 Long term (current) use of aspirin: Secondary | ICD-10-CM | POA: Insufficient documentation

## 2017-03-21 DIAGNOSIS — R109 Unspecified abdominal pain: Secondary | ICD-10-CM

## 2017-03-21 DIAGNOSIS — O26892 Other specified pregnancy related conditions, second trimester: Secondary | ICD-10-CM | POA: Insufficient documentation

## 2017-03-21 DIAGNOSIS — O9989 Other specified diseases and conditions complicating pregnancy, childbirth and the puerperium: Secondary | ICD-10-CM

## 2017-03-21 DIAGNOSIS — Z3A01 Less than 8 weeks gestation of pregnancy: Secondary | ICD-10-CM | POA: Diagnosis not present

## 2017-03-21 DIAGNOSIS — N760 Acute vaginitis: Secondary | ICD-10-CM | POA: Insufficient documentation

## 2017-03-21 DIAGNOSIS — O26891 Other specified pregnancy related conditions, first trimester: Secondary | ICD-10-CM

## 2017-03-21 DIAGNOSIS — F17200 Nicotine dependence, unspecified, uncomplicated: Secondary | ICD-10-CM | POA: Insufficient documentation

## 2017-03-21 DIAGNOSIS — Z880 Allergy status to penicillin: Secondary | ICD-10-CM | POA: Diagnosis not present

## 2017-03-21 DIAGNOSIS — Z79899 Other long term (current) drug therapy: Secondary | ICD-10-CM | POA: Diagnosis not present

## 2017-03-21 DIAGNOSIS — Z3491 Encounter for supervision of normal pregnancy, unspecified, first trimester: Secondary | ICD-10-CM

## 2017-03-21 LAB — URINALYSIS, ROUTINE W REFLEX MICROSCOPIC
Bilirubin Urine: NEGATIVE
GLUCOSE, UA: NEGATIVE mg/dL
HGB URINE DIPSTICK: NEGATIVE
Ketones, ur: NEGATIVE mg/dL
Leukocytes, UA: NEGATIVE
Nitrite: NEGATIVE
Protein, ur: NEGATIVE mg/dL
SPECIFIC GRAVITY, URINE: 1.01 (ref 1.005–1.030)
pH: 7 (ref 5.0–8.0)

## 2017-03-21 LAB — WET PREP, GENITAL
SPERM: NONE SEEN
Trich, Wet Prep: NONE SEEN
Yeast Wet Prep HPF POC: NONE SEEN

## 2017-03-21 LAB — POCT PREGNANCY, URINE: Preg Test, Ur: POSITIVE — AB

## 2017-03-21 LAB — CBC
HEMATOCRIT: 35.1 % — AB (ref 36.0–46.0)
HEMOGLOBIN: 11.8 g/dL — AB (ref 12.0–15.0)
MCH: 27.8 pg (ref 26.0–34.0)
MCHC: 33.6 g/dL (ref 30.0–36.0)
MCV: 82.6 fL (ref 78.0–100.0)
Platelets: 285 10*3/uL (ref 150–400)
RBC: 4.25 MIL/uL (ref 3.87–5.11)
RDW: 14.3 % (ref 11.5–15.5)
WBC: 5.6 10*3/uL (ref 4.0–10.5)

## 2017-03-21 LAB — HCG, QUANTITATIVE, PREGNANCY: hCG, Beta Chain, Quant, S: 19772 m[IU]/mL — ABNORMAL HIGH (ref <5)

## 2017-03-21 MED ORDER — METRONIDAZOLE 500 MG PO TABS: 500.0000 mg | ORAL_TABLET | Freq: Two times a day (BID) | ORAL | 0 refills | Status: AC

## 2017-03-21 NOTE — MAU Note (Signed)
Pt did HPT 2 days ago, was positive.  Has been cramping x 2 days, denies bleeding.  Has had N&V for the last 2 weeks.

## 2017-03-21 NOTE — MAU Provider Note (Signed)
Chief Complaint: Abdominal Pain   None     SUBJECTIVE HPI: Abigail Perry is a 26 y.o. G1P0 at [redacted]w[redacted]d by LMP who presents to maternity admissions reporting positive HPT 3 days ago with onset of cramping low in her abdomen 2 days ago. She has not tried any treatments. She reports 1 episode of pink discharge when wiping yesterday but none today. There are no other associated symptoms. The pain is intermittent, cramping pain low in her abdomen and does not radiate.  She denies vaginal bleeding, vaginal itching/burning, urinary symptoms, h/a, dizziness, n/v, or fever/chills.     HPI  Past Medical History:  Diagnosis Date  . Asthma    Past Surgical History:  Procedure Laterality Date  . WISDOM TOOTH EXTRACTION     Social History   Social History  . Marital status: Single    Spouse name: N/A  . Number of children: N/A  . Years of education: N/A   Occupational History  . Not on file.   Social History Main Topics  . Smoking status: Current Every Day Smoker  . Smokeless tobacco: Never Used  . Alcohol use Yes  . Drug use: No  . Sexual activity: Not on file   Other Topics Concern  . Not on file   Social History Narrative  . No narrative on file   No current facility-administered medications on file prior to encounter.    Current Outpatient Prescriptions on File Prior to Encounter  Medication Sig Dispense Refill  . Aspirin-Salicylamide-Caffeine (BC HEADACHE POWDER PO) Take 1 packet by mouth 2 (two) times daily as needed (headache).    . ciprofloxacin (CIPRO) 500 MG tablet Take 1 tablet (500 mg total) by mouth every 12 (twelve) hours. 10 tablet 0  . clindamycin (CLEOCIN) 300 MG capsule Take 1 capsule (300 mg total) by mouth 4 (four) times daily. X 7 days (Patient not taking: Reported on 10/22/2015) 28 capsule 0  . fluconazole (DIFLUCAN) 150 MG tablet Take 1 tablet (150 mg total) by mouth daily. (Patient not taking: Reported on 10/22/2015) 1 tablet 1  . ibuprofen (ADVIL,MOTRIN) 200  MG tablet Take 800 mg by mouth every 6 (six) hours as needed for headache or moderate pain.    . Multiple Vitamins-Minerals (MULTIVITAMIN & MINERAL PO) Take 1 tablet by mouth daily.    . naproxen (NAPROSYN) 500 MG tablet Take 1 tablet (500 mg total) by mouth 2 (two) times daily with a meal. (Patient not taking: Reported on 10/22/2015) 30 tablet 0   Allergies  Allergen Reactions  . Penicillins Anaphylaxis  . Sulfa Antibiotics     ROS:  Review of Systems  Constitutional: Negative for chills, fatigue and fever.  Respiratory: Negative for shortness of breath.   Cardiovascular: Negative for chest pain.  Genitourinary: Positive for pelvic pain. Negative for difficulty urinating, dysuria, flank pain, vaginal bleeding, vaginal discharge and vaginal pain.  Neurological: Negative for dizziness and headaches.  Psychiatric/Behavioral: Negative.      I have reviewed patient's Past Medical Hx, Surgical Hx, Family Hx, Social Hx, medications and allergies.   Physical Exam   Patient Vitals for the past 24 hrs:  BP Temp Temp src Pulse Resp Height Weight  03/21/17 1941 (!) 109/59 - - 75 16 - -  03/21/17 1820 105/63 98 F (36.7 C) Oral 81 16  (1.753 m) 175 lb (79.4 kg)   Constitutional: Well-developed, well-nourished female in no acute distress.  Cardiovascular: normal rate Respiratory: normal effort GI: Abd soft, non-tender. Pos BS  x 4 MS: Extremities nontender, no edema, normal ROM Neurologic: Alert and oriented x 4.  GU: Neg CVAT.  PELVIC EXAM: Cervix pink, visually closed, without lesion, moderate amount white creamy discharge, vaginal walls and external genitalia normal Bimanual exam: Cervix 0/long/high, firm, anterior, neg CMT, uterus nontender, nonenlarged, adnexa without tenderness, enlargement, or mass   LAB RESULTS Results for orders placed or performed during the hospital encounter of 03/21/17 (from the past 24 hour(s))  Urinalysis, Routine w reflex microscopic     Status:  None   Collection Time: 03/21/17  6:25 PM  Result Value Ref Range   Color, Urine YELLOW YELLOW   APPearance CLEAR CLEAR   Specific Gravity, Urine 1.010 1.005 - 1.030   pH 7.0 5.0 - 8.0   Glucose, UA NEGATIVE NEGATIVE mg/dL   Hgb urine dipstick NEGATIVE NEGATIVE   Bilirubin Urine NEGATIVE NEGATIVE   Ketones, ur NEGATIVE NEGATIVE mg/dL   Protein, ur NEGATIVE NEGATIVE mg/dL   Nitrite NEGATIVE NEGATIVE   Leukocytes, UA NEGATIVE NEGATIVE  Pregnancy, urine POC     Status: Abnormal   Collection Time: 03/21/17  7:15 PM  Result Value Ref Range   Preg Test, Ur POSITIVE (A) NEGATIVE  Wet prep, genital     Status: Abnormal   Collection Time: 03/21/17  7:48 PM  Result Value Ref Range   Yeast Wet Prep HPF POC NONE SEEN NONE SEEN   Trich, Wet Prep NONE SEEN NONE SEEN   Clue Cells Wet Prep HPF POC PRESENT (A) NONE SEEN   WBC, Wet Prep HPF POC FEW (A) NONE SEEN   Sperm NONE SEEN   CBC     Status: Abnormal   Collection Time: 03/21/17  8:02 PM  Result Value Ref Range   WBC 5.6 4.0 - 10.5 K/uL   RBC 4.25 3.87 - 5.11 MIL/uL   Hemoglobin 11.8 (L) 12.0 - 15.0 g/dL   HCT 16.1 (L) 09.6 - 04.5 %   MCV 82.6 78.0 - 100.0 fL   MCH 27.8 26.0 - 34.0 pg   MCHC 33.6 30.0 - 36.0 g/dL   RDW 40.9 81.1 - 91.4 %   Platelets 285 150 - 400 K/uL  hCG, quantitative, pregnancy     Status: Abnormal   Collection Time: 03/21/17  8:02 PM  Result Value Ref Range   hCG, Beta Chain, Quant, S 19,772 (H) <5 mIU/mL  ABO/Rh     Status: None (Preliminary result)   Collection Time: 03/21/17  8:02 PM  Result Value Ref Range   ABO/RH(D) O POS     --/--/O POS (04/02 2002)  IMAGING US Ob Comp Less 14 Wks  Result Date: 03/21/2017 CLINICAL DATA:  Two-day history of cramping with positive pregnancy test. EXAM: OBSTETRIC <14 WK [place] AND TRANSVAGINAL OB US TECHNIQUE: Both transabdominal and transvaginal ultrasound examinations were performed for complete evaluation of the gestation as well as the maternal uterus, adnexal regions,  and pelvic cul-de-sac. Transvaginal technique was performed to assess early pregnancy. COMPARISON:  None. FINDINGS: Intrauterine gestational sac: Single. Yolk sac:  Visualized. Embryo:  Not visualized. MSD: 11  mm   5 w   5  d Subchorionic hemorrhage:  None visualized. Maternal uterus/adnexae: Corpus luteum cyst identified right ovary. Left ovary unremarkable. Tiny amount of simple appearing free fluid evident the cul-de-sac. IMPRESSION: Single intrauterine gestational sac identified with yolk sac visible but no embryo yet evident. Repeat pelvic ultrasound in 7-10 days could be used to assess for appropriate pregnancy progression. Electronically Signed   By:  Kennith Center M.D.   On: 03/21/2017 20:40   US Ob Transvaginal  Result Date: 03/21/2017 CLINICAL DATA:  Two-day history of cramping with positive pregnancy test. EXAM: OBSTETRIC <14 WK [place] AND TRANSVAGINAL OB US TECHNIQUE: Both transabdominal and transvaginal ultrasound examinations were performed for complete evaluation of the gestation as well as the maternal uterus, adnexal regions, and pelvic cul-de-sac. Transvaginal technique was performed to assess early pregnancy. COMPARISON:  None. FINDINGS: Intrauterine gestational sac: Single. Yolk sac:  Visualized. Embryo:  Not visualized. MSD: 11  mm   5 w   5  d Subchorionic hemorrhage:  None visualized. Maternal uterus/adnexae: Corpus luteum cyst identified right ovary. Left ovary unremarkable. Tiny amount of simple appearing free fluid evident the cul-de-sac. IMPRESSION: Single intrauterine gestational sac identified with yolk sac visible but no embryo yet evident. Repeat pelvic ultrasound in 7-10 days could be used to assess for appropriate pregnancy progression. Electronically Signed   By: Kennith Center M.D.   On: 03/21/2017 20:40    MAU Management/MDM: Ordered labs and [place] and reviewed results.  IUP noted on [place] but no FP yet. Pt to start prenatal care as soon as possible, list of providers given.  Will  treat for BV with Flagyl 500 mg BID x 7 days.  Return to MAU as needed for emergencies. Pt stable at time of discharge.  ASSESSMENT 1. Normal IUP (intrauterine pregnancy) on prenatal ultrasound, first trimester   2. Abdominal pain during pregnancy, first trimester   3. BV (bacterial vaginosis)     PLAN Discharge home  Allergies as of 03/21/2017      Reactions   Penicillins Anaphylaxis   Sulfa Antibiotics       Medication List    STOP taking these medications   BC HEADACHE POWDER PO   ciprofloxacin 500 MG tablet Commonly known as:  CIPRO   clindamycin 300 MG capsule Commonly known as:  CLEOCIN   fluconazole 150 MG tablet Commonly known as:  DIFLUCAN   ibuprofen 200 MG tablet Commonly known as:  ADVIL,MOTRIN   MULTIVITAMIN & MINERAL PO   naproxen 500 MG tablet Commonly known as:  NAPROSYN     TAKE these medications   metroNIDAZOLE 500 MG tablet Commonly known as:  FLAGYL Take 1 tablet (500 mg total) by mouth 2 (two) times daily.      Follow-up Information    With prenatal provider of your choice Follow up.   Why:  See list of providers, return to MAU as needed for emergencies          Sharen Counter Certified Nurse-Midwife 03/21/2017  9:27 PM

## 2017-03-21 NOTE — Discharge Instructions (Signed)
[facility] Providers  ° ° °[facility]       Phone: [phone] ° °[facility] Phone: [phone] ° °[facility] [place]  Phone: [phone] ° °[facility] [place]  Phone: [phone] ° °[facility] [place]  Phone: [phone] ° °[facility]       Phone: [phone] ° °[facility]    Phone: [phone]  ° °[facility] ([place])    Phone: [phone] ° °[facility]    Phone: [phone] ° °[facility]    Phone: [phone] ° °[facility]    Phone: [phone] ° °[facility]       Phone: [phone]  ° °[facility]  Phone: [phone] ° °[facility]    Phone: [phone] ° °[facility]    Phone: [phone] ° °[facility]      Phone: [phone] ° °[facility]    Phone: [phone] ° °

## 2017-03-22 LAB — ABO/RH: ABO/RH(D): O POS

## 2017-03-22 LAB — GC/CHLAMYDIA PROBE AMP (~~LOC~~) NOT AT ARMC
Chlamydia: NEGATIVE
Neisseria Gonorrhea: NEGATIVE

## 2017-03-22 LAB — HIV ANTIBODY (ROUTINE TESTING W REFLEX): HIV Screen 4th Generation wRfx: NONREACTIVE

## 2017-04-25 ENCOUNTER — Encounter (HOSPITAL_COMMUNITY): Payer: Self-pay | Admitting: *Deleted

## 2017-04-25 ENCOUNTER — Inpatient Hospital Stay (HOSPITAL_COMMUNITY)
Admission: AD | Admit: 2017-04-25 | Discharge: 2017-04-25 | Disposition: A | Discharge status: Home / Self Care | Payer: Medicaid Other | Source: Ambulatory Visit | Attending: Obstetrics & Gynecology | Admitting: Obstetrics & Gynecology

## 2017-04-25 DIAGNOSIS — Z880 Allergy status to penicillin: Secondary | ICD-10-CM | POA: Insufficient documentation

## 2017-04-25 DIAGNOSIS — O26891 Other specified pregnancy related conditions, first trimester: Secondary | ICD-10-CM | POA: Insufficient documentation

## 2017-04-25 DIAGNOSIS — K219 Gastro-esophageal reflux disease without esophagitis: Secondary | ICD-10-CM | POA: Diagnosis not present

## 2017-04-25 DIAGNOSIS — R109 Unspecified abdominal pain: Secondary | ICD-10-CM | POA: Insufficient documentation

## 2017-04-25 DIAGNOSIS — Z3A10 10 weeks gestation of pregnancy: Secondary | ICD-10-CM | POA: Insufficient documentation

## 2017-04-25 DIAGNOSIS — R079 Chest pain, unspecified: Secondary | ICD-10-CM | POA: Diagnosis not present

## 2017-04-25 DIAGNOSIS — R350 Frequency of micturition: Secondary | ICD-10-CM | POA: Diagnosis not present

## 2017-04-25 LAB — URINALYSIS, ROUTINE W REFLEX MICROSCOPIC
Bilirubin Urine: NEGATIVE
Glucose, UA: NEGATIVE mg/dL
Ketones, ur: 80 mg/dL — AB
Nitrite: NEGATIVE
PROTEIN: NEGATIVE mg/dL
Specific Gravity, Urine: 1.027 (ref 1.005–1.030)
pH: 5 (ref 5.0–8.0)

## 2017-04-25 MED ORDER — NITROFURANTOIN MONOHYD MACRO 100 MG PO CAPS: 100.0000 mg | ORAL_CAPSULE | Freq: Two times a day (BID) | ORAL | 0 refills | Status: AC

## 2017-04-25 MED ORDER — GI COCKTAIL ~~LOC~~
30.0000 mL | Freq: Once | ORAL | Status: AC
  Administered 2017-04-25: 30 mL via ORAL
  Filled 2017-04-25: qty 30

## 2017-04-25 MED ORDER — RANITIDINE HCL 150 MG PO TABS: 150.0000 mg | ORAL_TABLET | Freq: Two times a day (BID) | ORAL | 1 refills | Status: AC

## 2017-04-25 MED ORDER — TERCONAZOLE 0.4 % VA CREA
1.0000 | TOPICAL_CREAM | Freq: Every day | VAGINAL | 0 refills | Status: DC
Start: 2017-04-25 — End: 2017-11-11

## 2017-04-25 NOTE — Discharge Instructions (Signed)
Food Choices for Gastroesophageal Reflux Disease, Adult When you have gastroesophageal reflux disease (GERD), the foods you eat and your eating habits are very important. Choosing the right foods can help ease your discomfort. What guidelines do I need to follow?  Choose fruits, vegetables, whole grains, and low-fat dairy products.  Choose low-fat meat, fish, and poultry.  Limit fats such as oils, salad dressings, butter, nuts, and avocado.  Keep a food diary. This helps you identify foods that cause symptoms.  Avoid foods that cause symptoms. These may be different for everyone.  Eat small meals often instead of 3 large meals a day.  Eat your meals slowly, in a place where you are relaxed.  Limit fried foods.  Cook foods using methods other than frying.  Avoid drinking alcohol.  Avoid drinking large amounts of liquids with your meals.  Avoid bending over or lying down until 2-3 hours after eating. What foods are not recommended? These are some foods and drinks that may make your symptoms worse: Vegetables  Tomatoes. Tomato juice. Tomato and spaghetti sauce. Chili peppers. Onion and garlic. Horseradish. Fruits  Oranges, grapefruit, and lemon (fruit and juice). Meats  High-fat meats, fish, and poultry. This includes hot dogs, ribs, ham, sausage, salami, and bacon. Dairy  Whole milk and chocolate milk. Sour cream. Cream. Butter. Ice cream. Cream cheese. Drinks  Coffee and tea. Bubbly (carbonated) drinks or energy drinks. Condiments  Hot sauce. Barbecue sauce. Sweets/Desserts  Chocolate and cocoa. Donuts. Peppermint and spearmint. Fats and Oils  High-fat foods. This includes French fries and potato chips. Other  Vinegar. Strong spices. This includes black pepper, white pepper, red pepper, cayenne, curry powder, cloves, ginger, and chili powder. The items listed above may not be a complete list of foods and drinks to avoid. Contact your dietitian for more information.    This information is not intended to replace advice given to you by your health care provider. Make sure you discuss any questions you have with your health care provider. Document Released: 06/06/2012 Document Revised: 05/13/2016 Document Reviewed: 10/10/2013 Elsevier Interactive Patient Education  2017 [employer]vier Inc.  

## 2017-04-25 NOTE — MAU Provider Note (Signed)
History     CSN: [id number]  Arrival date and time: 04/25/17 4098   First Provider Initiated Contact with Patient 04/25/17 [phone]      Chief Complaint  Patient presents with  . Chest Pain  . Abdominal Pain  . Back Pain   HPI    Ms.Abigail Perry is a 26 y.o. female who presents to MAU with chest pain and abdominal pain. The chest pain started at 5 pm yesterday. The chest pain is not new, she has had this in the past. The pain in her chest comes and goes. She has not tried anything for the pain. She had a chicken sand which and fries at 0200 which made the pain worse.  Patient says she eats a lot of fast food. Patient says she is too tired to cook. She does not drink soda. And states she drinks large amounts of water.   Abdominal pain started 2 days ago. The pain is in her lower abdomen. The pain is constant. She has never had this pain before. She has not taken anything for the symptoms.   OB History    Gravida Para Term Preterm AB Living   1             SAB TAB Ectopic Multiple Live Births                  Past Medical History:  Diagnosis Date  . Asthma     Past Surgical History:  Procedure Laterality Date  . WISDOM TOOTH EXTRACTION      History reviewed. No pertinent family history.  Social History  Substance Use Topics  . Smoking status: Current Every Day Smoker  . Smokeless tobacco: Never Used  . Alcohol use Yes    Allergies:  Allergies  Allergen Reactions  . Penicillins Anaphylaxis    Has patient had a PCN reaction causing immediate rash, facial/tongue/throat swelling, SOB or lightheadedness with hypotension: Yes Has patient had a PCN reaction causing severe rash involving mucus membranes or skin necrosis: No Has patient had a PCN reaction that required hospitalization No Has patient had a PCN reaction occurring within the last 10 years: No If all of the above answers are "NO", then may proceed with Cephalosporin use.  . Sulfa Antibiotics Swelling     Prescriptions Prior to Admission  Medication Sig Dispense Refill Last Dose  . albuterol (PROVENTIL HFA;VENTOLIN HFA) 108 (90 Base) MCG/ACT inhaler Inhale 2 puffs into the lungs every 6 (six) hours as needed for wheezing or shortness of breath.     . Prenatal Vit-Fe Fumarate-FA (PRENATAL MULTIVITAMIN) TABS tablet Take 1 tablet by mouth daily at 12 noon.   04/24/2017 at Unknown time   No results found for this or any previous visit (from the past 48 hour(s)).  Review of Systems  Cardiovascular: Positive for chest pain.  Gastrointestinal: Positive for abdominal pain and constipation. Negative for diarrhea, nausea and vomiting.  Genitourinary: Negative for dysuria.   Physical Exam   Blood pressure 119/66, pulse 88, temperature 98.9 F (37.2 C), temperature source Oral, resp. rate 18, last menstrual period 02/12/2017, SpO2 100 %.  Physical Exam  Constitutional: She is oriented to person, place, and time. She appears well-developed and well-nourished.  Non-toxic appearance. She does not have a sickly appearance. She does not appear ill. No distress.  HENT:  Head: Normocephalic.  Eyes: Pupils are equal, round, and reactive to light.  Cardiovascular: Normal rate and normal heart sounds.   GI: Soft. Normal  appearance. There is tenderness in the suprapubic area. There is no rigidity, no rebound and no guarding.  Musculoskeletal: Normal range of motion.  Neurological: She is alert and oriented to person, place, and time.  Skin: Skin is warm. She is not diaphoretic.  Psychiatric: Her behavior is normal.    MAU Course  Procedures  None  MDM  UA Urine culture + fetal heart tones via doppler  GI cocktail given: Pain declining following GI cocktail. Patient is dressed in the room and says she is ready to go home.   Assessment and Plan    A:  1. Urinary frequency   2. Gastroesophageal reflux disease, esophagitis presence not specified     P:  Discharge home in stable  condition Rx: Zantac, Macrobid, Terazol Return to MAU if symptoms worsen Avoid fried/fast foods Increase PO fluid intake    Rebel Laughridge, Harolyn RutherfordJennifer I, NP 04/25/2017 9:45 AM

## 2017-04-25 NOTE — MAU Note (Addendum)
Patient c/o chest tightness that comes and goes. Started last night following an emotional altercation with FOB. Exacerbated by movement; okay if sitting. States its hard to catch her breath.  Patient c/o lower abdominal and back cramping. Had this pain at last visit but it started back yesterday and is worse than before. Endorses having a hard time at work due to the pain. States after taking the flagyl last time she got a yeast infection; tried monistat but still having irritation and discharge.

## 2017-04-26 LAB — CULTURE, OB URINE: Special Requests: NORMAL

## 2017-05-31 ENCOUNTER — Encounter (HOSPITAL_COMMUNITY): Payer: Self-pay | Admitting: Emergency Medicine

## 2017-05-31 ENCOUNTER — Emergency Department (HOSPITAL_COMMUNITY)
Admission: EM | Admit: 2017-05-31 | Discharge: 2017-05-31 | Disposition: A | Discharge status: Home / Self Care | Payer: Medicaid Other | Attending: Emergency Medicine | Admitting: Emergency Medicine

## 2017-05-31 ENCOUNTER — Emergency Department (HOSPITAL_COMMUNITY): Payer: Medicaid Other

## 2017-05-31 DIAGNOSIS — F17200 Nicotine dependence, unspecified, uncomplicated: Secondary | ICD-10-CM | POA: Insufficient documentation

## 2017-05-31 DIAGNOSIS — Y9289 Other specified places as the place of occurrence of the external cause: Secondary | ICD-10-CM | POA: Insufficient documentation

## 2017-05-31 DIAGNOSIS — O9A212 Injury, poisoning and certain other consequences of external causes complicating pregnancy, second trimester: Secondary | ICD-10-CM | POA: Diagnosis present

## 2017-05-31 DIAGNOSIS — Y9389 Activity, other specified: Secondary | ICD-10-CM | POA: Diagnosis not present

## 2017-05-31 DIAGNOSIS — Z3A15 15 weeks gestation of pregnancy: Secondary | ICD-10-CM | POA: Diagnosis not present

## 2017-05-31 DIAGNOSIS — W0110XA Fall on same level from slipping, tripping and stumbling with subsequent striking against unspecified object, initial encounter: Secondary | ICD-10-CM | POA: Insufficient documentation

## 2017-05-31 DIAGNOSIS — Y999 Unspecified external cause status: Secondary | ICD-10-CM | POA: Insufficient documentation

## 2017-05-31 DIAGNOSIS — J45909 Unspecified asthma, uncomplicated: Secondary | ICD-10-CM | POA: Insufficient documentation

## 2017-05-31 DIAGNOSIS — O99332 Smoking (tobacco) complicating pregnancy, second trimester: Secondary | ICD-10-CM | POA: Diagnosis not present

## 2017-05-31 DIAGNOSIS — W19XXXA Unspecified fall, initial encounter: Secondary | ICD-10-CM

## 2017-05-31 DIAGNOSIS — S060X1A Concussion with loss of consciousness of 30 minutes or less, initial encounter: Secondary | ICD-10-CM | POA: Diagnosis not present

## 2017-05-31 DIAGNOSIS — S0101XA Laceration without foreign body of scalp, initial encounter: Secondary | ICD-10-CM

## 2017-05-31 MED ORDER — LIDOCAINE-EPINEPHRINE (PF) 2 %-1:200000 IJ SOLN
10.0000 mL | Freq: Once | INTRAMUSCULAR | Status: AC
  Administered 2017-05-31: 10 mL via INTRADERMAL
  Filled 2017-05-31: qty 20

## 2017-05-31 MED ORDER — ACETAMINOPHEN 325 MG PO TABS
650.0000 mg | ORAL_TABLET | Freq: Once | ORAL | Status: AC
  Administered 2017-05-31: 650 mg via ORAL
  Filled 2017-05-31: qty 2

## 2017-05-31 NOTE — ED Notes (Signed)
BIB EMS from home after unwitnessed fall, per pt she slipped and fell on concrete outside d/t wet ground. LOC for approx 10 min. Pt was picked up and brought back inside by brother and fiance. Pt is pregnant, 15wks 2 days. Pt has had no prenatal follow up, only a "questionaire visit" A/OX4, hematoma with laceration to back of head. VSS.

## 2017-05-31 NOTE — ED Provider Notes (Signed)
[facility]-EMERGENCY DEPT Provider Note   CSN: [id number] Arrival date & time: 05/31/17  4098  By signing my name below, I, Freida Busman, attest that this documentation has been prepared under the direction and in the presence of Pricilla Loveless, MD . Electronically Signed: Freida Busman, Scribe. 05/31/2017. 2:56 AM.   History   Chief Complaint Chief Complaint  Patient presents with  . Fall    The history is provided by the patient. No language interpreter was used.     HPI Comments:  Abigail Perry is a 26 y.o. female who presents to the Emergency Department via EMS s/p fall that occurred in the last 24 hours. She states she slipped on wet cement and landed on concrete ground but does not remember when the fall occured and only remembers waking up at her brother's house afterwards. Pt states EMS reports LOC for 10 minutes. She is complaining of a 10/10 throbbing HA with pain to the posterior head following the incident. She notes associated right sided neck pain and a laceration to the back of the head. No weakness/numbness in her extremities. She is currently ~[redacted] weeks pregnant; states she has been vomiting all day, one of the episodes occurred after the fall. She denies abdominal pain, and vaginal bleeding.  No alleviating factors noted.    Past Medical History:  Diagnosis Date  . Asthma     There are no active problems to display for this patient.   Past Surgical History:  Procedure Laterality Date  . WISDOM TOOTH EXTRACTION      OB History    Gravida Para Term Preterm AB Living   1             SAB TAB Ectopic Multiple Live Births                   Home Medications    Prior to Admission medications   Medication Sig Start Date End Date Taking? Authorizing Provider  albuterol (PROVENTIL HFA;VENTOLIN HFA) 108 (90 Base) MCG/ACT inhaler Inhale 2 puffs into the lungs every 6 (six) hours as needed for wheezing or shortness of breath.    [provider]    nitrofurantoin, macrocrystal-monohydrate, (MACROBID) 100 MG capsule Take 1 capsule (100 mg total) by mouth 2 (two) times daily. 04/25/17   Rasch, Harolyn Rutherford, NP  Prenatal Vit-Fe Fumarate-FA (PRENATAL MULTIVITAMIN) TABS tablet Take 1 tablet by mouth daily at 12 noon.    [provider]  ranitidine (ZANTAC) 150 MG tablet Take 1 tablet (150 mg total) by mouth 2 (two) times daily. 04/25/17   Rasch, Victorino Dike I, NP  terconazole (TERAZOL 7) 0.4 % vaginal cream Place 1 applicator vaginally at bedtime. 04/25/17   Rasch, Harolyn Rutherford, NP    Family History No family history on file.  Social History Social History  Substance Use Topics  . Smoking status: Current Every Day Smoker  . Smokeless tobacco: Never Used  . Alcohol use Yes     Allergies   Penicillins and Sulfa antibiotics   Review of Systems Review of Systems  Gastrointestinal: Positive for nausea and vomiting. Negative for abdominal pain.  Genitourinary: Negative for vaginal bleeding.  Musculoskeletal: Positive for neck pain.  Skin: Positive for wound.  Neurological: Positive for headaches. Negative for weakness and numbness.  All other systems reviewed and are negative.    Physical Exam Updated Vital Signs BP 123/76 (BP Location: Right Arm)   Pulse 86   Temp 98.1 F (36.7 C) (Oral)  Resp 18   Ht 5\' 8"  (1.727 m)   Wt 81.6 kg (180 lb)   LMP 02/12/2017   SpO2 100%   BMI 27.37 kg/m   Physical Exam  Constitutional: She is oriented to person, place, and time. She appears well-developed and well-nourished.  HENT:  Head: Normocephalic. Head is with laceration.    Right Ear: External ear normal.  Left Ear: External ear normal.  Nose: Nose normal.  Eyes: Right eye exhibits no discharge. Left eye exhibits no discharge.  Neck:  Diffuse muscular and bony tenderness of posterior neck   Cardiovascular: Normal rate, regular rhythm and normal heart sounds.   Pulmonary/Chest: Effort normal and breath sounds normal.   Abdominal: Soft. There is tenderness.  Mild lower abdominal tenderness  Neurological: She is alert and oriented to person, place, and time.  CN 3-12 grossly intact. 5/5 strength in all 4 extremities. Grossly normal sensation. Normal finger to nose.   Skin: Skin is warm and dry.  Nursing note and vitals reviewed.    ED Treatments / Results  DIAGNOSTIC STUDIES:  Oxygen Saturation is 100% on RA, normal by my interpretation.    COORDINATION OF CARE:  2:48 AM Discussed treatment plan with pt at bedside and pt agreed to plan.  Labs (all labs ordered are listed, but only abnormal results are displayed) Labs Reviewed - No data to display  EKG  EKG Interpretation None       Radiology Ct Head Wo Contrast  Result Date: 05/31/2017 CLINICAL DATA:  Status post fall backwards, hitting back of head, with soft tissue laceration. Headache. Concern for cervical spine injury. Initial encounter. EXAM: CT HEAD WITHOUT CONTRAST CT CERVICAL SPINE WITHOUT CONTRAST TECHNIQUE: Multidetector CT imaging of the head and cervical spine was performed following the standard protocol without intravenous contrast. Multiplanar CT image reconstructions of the cervical spine were also generated. COMPARISON:  CT of the cervical spine performed 03/09/2011 FINDINGS: CT HEAD FINDINGS Brain: No evidence of acute infarction, hemorrhage, hydrocephalus, extra-axial collection or mass lesion/mass effect. The posterior fossa, including the cerebellum, brainstem and fourth ventricle, is within normal limits. The third and lateral ventricles, and basal ganglia are unremarkable in appearance. The cerebral hemispheres are symmetric in appearance, with normal gray-white differentiation. No mass effect or midline shift is seen. Vascular: No hyperdense vessel or unexpected calcification. Skull: There is no evidence of fracture; visualized osseous structures are unremarkable in appearance. Sinuses/Orbits: The visualized portions of the  orbits are within normal limits. The paranasal sinuses and mastoid air cells are well-aerated. Other: Soft tissue swelling is noted overlying the high right posterior parietal calvarium, with associated soft tissue laceration. CT CERVICAL SPINE FINDINGS Alignment: Normal. Skull base and vertebrae: No acute fracture. No primary bone lesion or focal pathologic process. There is incomplete fusion of the posterior arch of C1. Soft tissues and spinal canal: No prevertebral fluid or swelling. No visible canal hematoma. Disc levels: Intervertebral disc spaces are preserved. The bony foramina are grossly unremarkable. Upper chest: The visualized lung apices are clear. The thyroid gland is unremarkable in appearance. Other: No additional soft tissue abnormalities are seen. IMPRESSION: 1. No evidence of traumatic intracranial injury or fracture. 2. No evidence of fracture or subluxation along the cervical spine. 3. Soft tissue swelling overlying the high right posterior parietal calvarium, with associated soft tissue laceration. Electronically Signed   By: Roanna Raider M.D.   On: 05/31/2017 03:55   Ct Cervical Spine Wo Contrast  Result Date: 05/31/2017 CLINICAL DATA:  Status post  fall backwards, hitting back of head, with soft tissue laceration. Headache. Concern for cervical spine injury. Initial encounter. EXAM: CT HEAD WITHOUT CONTRAST CT CERVICAL SPINE WITHOUT CONTRAST TECHNIQUE: Multidetector CT imaging of the head and cervical spine was performed following the standard protocol without intravenous contrast. Multiplanar CT image reconstructions of the cervical spine were also generated. COMPARISON:  CT of the cervical spine performed 03/09/2011 FINDINGS: CT HEAD FINDINGS Brain: No evidence of acute infarction, hemorrhage, hydrocephalus, extra-axial collection or mass lesion/mass effect. The posterior fossa, including the cerebellum, brainstem and fourth ventricle, is within normal limits. The third and lateral  ventricles, and basal ganglia are unremarkable in appearance. The cerebral hemispheres are symmetric in appearance, with normal gray-white differentiation. No mass effect or midline shift is seen. Vascular: No hyperdense vessel or unexpected calcification. Skull: There is no evidence of fracture; visualized osseous structures are unremarkable in appearance. Sinuses/Orbits: The visualized portions of the orbits are within normal limits. The paranasal sinuses and mastoid air cells are well-aerated. Other: Soft tissue swelling is noted overlying the high right posterior parietal calvarium, with associated soft tissue laceration. CT CERVICAL SPINE FINDINGS Alignment: Normal. Skull base and vertebrae: No acute fracture. No primary bone lesion or focal pathologic process. There is incomplete fusion of the posterior arch of C1. Soft tissues and spinal canal: No prevertebral fluid or swelling. No visible canal hematoma. Disc levels: Intervertebral disc spaces are preserved. The bony foramina are grossly unremarkable. Upper chest: The visualized lung apices are clear. The thyroid gland is unremarkable in appearance. Other: No additional soft tissue abnormalities are seen. IMPRESSION: 1. No evidence of traumatic intracranial injury or fracture. 2. No evidence of fracture or subluxation along the cervical spine. 3. Soft tissue swelling overlying the high right posterior parietal calvarium, with associated soft tissue laceration. Electronically Signed   By: Roanna Raider M.D.   On: 05/31/2017 03:55    Procedures .Marland KitchenLaceration Repair Date/Time: 05/31/2017 5:38 AM Performed by: Pricilla Loveless Authorized by: Pricilla Loveless   Consent:    Consent obtained:  Verbal   Consent given by:  Patient Anesthesia (see MAR for exact dosages):    Anesthesia method:  Local infiltration   Local anesthetic:  Lidocaine 2% WITH epi Laceration details:    Location:  Scalp   Scalp location:  Occipital   Length (cm):  2 Repair type:     Repair type:  Simple Pre-procedure details:    Preparation:  Patient was prepped and draped in usual sterile fashion and imaging obtained to evaluate for foreign bodies Treatment:    Amount of cleaning:  Standard   Irrigation solution:  Sterile saline   Irrigation method:  Syringe Skin repair:    Repair method:  Staples   Number of staples:  3 Approximation:    Approximation:  Close   Vermilion border: well-aligned   Post-procedure details:    Dressing:  Antibiotic ointment   Patient tolerance of procedure:  Tolerated well, no immediate complications   (including critical care time)  EMERGENCY DEPARTMENT [place] PREGNANCY "Study: Limited Ultrasound of the Pelvis for Pregnancy"  INDICATIONS:Pregnancy(required) and Abdominal or pelvic pain Multiple views of the uterus and pelvic cavity were obtained in real-time with a multi-frequency probe.  APPROACH:Transabdominal  PERFORMED BY: Myself IMAGES ARCHIVED?: Yes LIMITATIONS: none PREGNANCY FREE FLUID: None GESTATIONAL AGE, ESTIMATE: 15 wk FETAL HEART RATE: 146 INTERPRETATION: Fetal heart activity seen    Medications Ordered in ED Medications  acetaminophen (TYLENOL) tablet 650 mg (650 mg Oral Given 05/31/17 0304)  lidocaine-EPINEPHrine (  XYLOCAINE W/EPI) 2 %-1:200000 (PF) injection 10 mL (10 mLs Intradermal Given 05/31/17 0509)     Initial Impression / Assessment and Plan / ED Course  I have reviewed the triage vital signs and the nursing notes.  Pertinent labs & imaging results that were available during my care of the patient were reviewed by me and considered in my medical decision making (see chart for details).     Patient has mild abd pain on exam, though this has resolved (and was only present temporarily). U/S shows fetus with adequate HR. No free fluid. CT obtained given concussive symptoms, LOC, continued HA. Negative. Lac repaired. Neuro exam benign. F/u with OB and PCP. Discussed return precautions.  Final  Clinical Impressions(s) / ED Diagnoses   Final diagnoses:  Fall, initial encounter  Laceration of scalp, initial encounter  Concussion with loss of consciousness of 30 minutes or less, initial encounter    New Prescriptions Discharge Medication List as of 05/31/2017  5:34 AM     I personally performed the services described in this documentation, which was scribed in my presence. The recorded information has been reviewed and is accurate.     Pricilla LovelessGoldston, Shanta Dorvil, MD 05/31/17 1025

## 2017-11-11 ENCOUNTER — Encounter (HOSPITAL_COMMUNITY): Payer: Self-pay

## 2017-11-11 ENCOUNTER — Other Ambulatory Visit: Payer: Self-pay

## 2017-11-11 ENCOUNTER — Inpatient Hospital Stay (HOSPITAL_COMMUNITY)
Admission: AD | Admit: 2017-11-11 | Discharge: 2017-11-11 | Disposition: A | Discharge status: Home / Self Care | Payer: Medicaid Other | Source: Ambulatory Visit | Attending: Obstetrics and Gynecology | Admitting: Obstetrics and Gynecology

## 2017-11-11 DIAGNOSIS — L989 Disorder of the skin and subcutaneous tissue, unspecified: Secondary | ICD-10-CM | POA: Diagnosis not present

## 2017-11-11 DIAGNOSIS — Z3A38 38 weeks gestation of pregnancy: Secondary | ICD-10-CM | POA: Insufficient documentation

## 2017-11-11 DIAGNOSIS — R197 Diarrhea, unspecified: Secondary | ICD-10-CM | POA: Diagnosis not present

## 2017-11-11 DIAGNOSIS — O26893 Other specified pregnancy related conditions, third trimester: Secondary | ICD-10-CM

## 2017-11-11 DIAGNOSIS — O9989 Other specified diseases and conditions complicating pregnancy, childbirth and the puerperium: Secondary | ICD-10-CM | POA: Insufficient documentation

## 2017-11-11 DIAGNOSIS — O99333 Smoking (tobacco) complicating pregnancy, third trimester: Secondary | ICD-10-CM | POA: Diagnosis not present

## 2017-11-11 DIAGNOSIS — O26899 Other specified pregnancy related conditions, unspecified trimester: Secondary | ICD-10-CM

## 2017-11-11 HISTORY — DX: Anemia, unspecified: D64.9

## 2017-11-11 LAB — URINALYSIS, ROUTINE W REFLEX MICROSCOPIC
BILIRUBIN URINE: NEGATIVE
Glucose, UA: NEGATIVE mg/dL
HGB URINE DIPSTICK: NEGATIVE
Ketones, ur: NEGATIVE mg/dL
Leukocytes, UA: NEGATIVE
NITRITE: NEGATIVE
PROTEIN: NEGATIVE mg/dL
Specific Gravity, Urine: 1.014 (ref 1.005–1.030)
pH: 7 (ref 5.0–8.0)

## 2017-11-11 NOTE — Discharge Instructions (Signed)
Drink at least 8 8-oz glasses of water every day. Take Tylenol 325 mg 2 tablets by mouth every 4 hours if needed for pain. Take one dose of immodium Take your medications as ordered by your doctor. If you develop fever, vomiting or worsening symptoms, see you doctor.

## 2017-11-11 NOTE — MAU Note (Signed)
Pt presents to MAU with c/o diarrhea that started today and lower abdominal cramping. Pt has had light pink spotting today and denies LOF. +FM

## 2017-11-11 NOTE — MAU Provider Note (Signed)
History     CSN: [id number]  Arrival date and time: 11/11/17 1136   First Provider Initiated Contact with Patient 11/11/17 1330      Chief Complaint  Patient presents with  . Abdominal Pain  . Diarrhea   HPI Abigail Perry 26 y.o. [id number]  [facility] [place] but today she is having diarrhea and is visiting in [place].  Came here for evaluation.  Reports brown watery discharge that has some blood noted in it.  Has had one diarrhea stool while here in [facility].  Called her OB and was told to take immodium but she came here for evaluation and has not taken the immodium.  Having nausea but no vomiting.  Has taken some fluids today.  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  OB History    Gravida Para Term Preterm AB Living   1             SAB TAB Ectopic Multiple Live Births                  Past Medical History:  Diagnosis Date  . Anemia   . Asthma     Past Surgical History:  Procedure Laterality Date  . WISDOM TOOTH EXTRACTION      History reviewed. No pertinent family history.  Social History   Tobacco Use  . Smoking status: Current Every Day Smoker  . Smokeless tobacco: Never Used  Substance Use Topics  . Alcohol use: Yes  . Drug use: No    Allergies:  Allergies  Allergen Reactions  . Mushroom Extract Complex Anaphylaxis  . Penicillins Anaphylaxis    Has patient had a PCN reaction causing immediate rash, facial/tongue/throat swelling, SOB or lightheadedness with hypotension: Yes Has patient had a PCN reaction causing severe rash involving mucus membranes or skin necrosis: No Has patient had a PCN reaction that required hospitalization No Has patient had a PCN reaction occurring within the last 10 years: No If all of the above answers are "NO", then may proceed with Cephalosporin use.  . Sulfa Antibiotics Swelling    Medications Prior to Admission  Medication Sig Dispense Refill Last Dose  . acetaminophen (TYLENOL) 325 MG tablet  Take 650 mg by mouth every 6 (six) hours as needed for mild pain or headache.   Past Week at Unknown time  . albuterol (ACCUNEB) 0.63 MG/3ML nebulizer solution Take 1 ampule by nebulization every 6 (six) hours as needed for wheezing.   prn  . Ca Carbonate-Mag Hydroxide (ROLAIDS EXTRA STRENGTH) 675-135 MG CHEW Chew 1 tablet by mouth 2 (two) times daily as needed (heartburn).   prn  . ferrous sulfate 325 (65 FE) MG tablet Take 325 mg by mouth 2 (two) times daily with a meal.   11/10/2017 at Unknown time  . flintstones complete (FLINTSTONES) 60 MG chewable tablet Chew 2 tablets by mouth daily.   11/10/2017 at Unknown time  . ranitidine (ZANTAC) 150 MG tablet Take 1 tablet (150 mg total) by mouth 2 (two) times daily. 60 tablet 1 11/10/2017 at Unknown time  . valACYclovir (VALTREX) 1000 MG tablet TK 1 T PO QD  2 11/10/2017 at Unknown time  . albuterol (PROVENTIL HFA;VENTOLIN HFA) 108 (90 Base) MCG/ACT inhaler Inhale 2 puffs into the lungs every 6 (six) hours as needed for wheezing or shortness of breath.   prn  . nitrofurantoin, macrocrystal-monohydrate, (MACROBID) 100 MG capsule Take 1 capsule (100 mg total) by mouth 2 (  two) times daily. 10 capsule 0   . Prenatal Vit-Fe Fumarate-FA (PRENATAL MULTIVITAMIN) TABS tablet Take 1 tablet by mouth daily at 12 noon.   04/24/2017 at Unknown time  . terconazole (TERAZOL 7) 0.4 % vaginal cream Place 1 applicator vaginally at bedtime. 45 g 0     Review of Systems  Gastrointestinal: Positive for abdominal pain, blood in stool, diarrhea and nausea. Negative for vomiting.  Genitourinary: Positive for vaginal bleeding. Negative for vaginal discharge.   Physical Exam   Blood pressure 120/75, pulse 83, temperature 98.3 F (36.8 C), temperature source Oral, resp. rate 18, last menstrual period 02/12/2017, SpO2 100 %.  Physical Exam  Nursing note and vitals reviewed. Constitutional: She is oriented to person, place, and time. She appears well-developed and  well-nourished.  HENT:  Head: Normocephalic.  Eyes: EOM are normal.  Neck: Neck supple.  GI: Soft. There is no tenderness. There is no rebound and no guarding.  Genitourinary:  Genitourinary Comments: Speculum exam: Vulva - no blood seen, also has open flat lesion noted on right upper buttock to the side of the rectum, no yellow covering but open area in outer layer of skin noted Vagina - Small amount of white discharge, no odor Cervix - No contact bleeding, appears closed Bimanual exam: Cervix closed and posterior, baby's head is down in pelvis HSV done on open lesion with unroofing by the swab is done.  Small amount of blood noted.  No drainage after the specimen is taken. Rectum is clear with no hemorrhoids and no blood seen. Chaperone present for exam.   Musculoskeletal: Normal range of motion.  Neurological: She is alert and oriented to person, place, and time.  Skin: Skin is warm and dry.  Psychiatric: She has a normal mood and affect.    MAU Course  Procedures Results for orders placed or performed during the hospital encounter of 11/11/17 (from the past 24 hour(s))  Urinalysis, Routine w reflex microscopic     Status: Abnormal   Collection Time: 11/11/17 11:43 AM  Result Value Ref Range   Color, Urine YELLOW YELLOW   APPearance HAZY (A) CLEAR   Specific Gravity, Urine 1.014 1.005 - 1.030   pH 7.0 5.0 - 8.0   Glucose, UA NEGATIVE NEGATIVE mg/dL   Hgb urine dipstick NEGATIVE NEGATIVE   Bilirubin Urine NEGATIVE NEGATIVE   Ketones, ur NEGATIVE NEGATIVE mg/dL   Protein, ur NEGATIVE NEGATIVE mg/dL   Nitrite NEGATIVE NEGATIVE   Leukocytes, UA NEGATIVE NEGATIVE    MDM Will culture for possible HSV. Description of blood in the toilet is not consistent with frank rectal bleeding but is more likely bleeding from irritation of repetitive stools.  Assessment and Plan  Diarrhea in pregnancy, third trimester Lesion on right buttock near the rectum suspicious for  HSV  Plan HSV culture done on "boil" Reviewed dietary recommendations for diarrhea. Drink at least 8 8-oz glasses of water every day. See your doctor at your scheduled appointment next week Take one dose of immodium as suggested by your doctor earlier today. Return with fever, vomiting or bright red blood filling the toilet bowl.   Rhylen Pulido L Rjay Revolorio 11/11/2017, 1:55 PM

## 2017-11-13 LAB — HSV CULTURE AND TYPING

## 2018-02-11 ENCOUNTER — Encounter (HOSPITAL_COMMUNITY): Payer: Self-pay

## 2019-01-29 ENCOUNTER — Emergency Department (HOSPITAL_COMMUNITY): Payer: Self-pay

## 2019-01-29 ENCOUNTER — Emergency Department (HOSPITAL_COMMUNITY)
Admission: EM | Admit: 2019-01-29 | Discharge: 2019-01-29 | Disposition: A | Discharge status: Home / Self Care | Payer: Self-pay | Attending: Emergency Medicine | Admitting: Emergency Medicine

## 2019-01-29 ENCOUNTER — Encounter (HOSPITAL_COMMUNITY): Payer: Self-pay

## 2019-01-29 DIAGNOSIS — Y9389 Activity, other specified: Secondary | ICD-10-CM | POA: Insufficient documentation

## 2019-01-29 DIAGNOSIS — S8252XA Displaced fracture of medial malleolus of left tibia, initial encounter for closed fracture: Secondary | ICD-10-CM | POA: Insufficient documentation

## 2019-01-29 DIAGNOSIS — J45909 Unspecified asthma, uncomplicated: Secondary | ICD-10-CM | POA: Insufficient documentation

## 2019-01-29 DIAGNOSIS — Y929 Unspecified place or not applicable: Secondary | ICD-10-CM | POA: Insufficient documentation

## 2019-01-29 DIAGNOSIS — Z79899 Other long term (current) drug therapy: Secondary | ICD-10-CM | POA: Insufficient documentation

## 2019-01-29 DIAGNOSIS — Y999 Unspecified external cause status: Secondary | ICD-10-CM | POA: Insufficient documentation

## 2019-01-29 DIAGNOSIS — F17200 Nicotine dependence, unspecified, uncomplicated: Secondary | ICD-10-CM | POA: Insufficient documentation

## 2019-01-29 MED ORDER — IBUPROFEN 800 MG PO TABS
800.0000 mg | ORAL_TABLET | Freq: Once | ORAL | Status: AC
  Administered 2019-01-29: 800 mg via ORAL
  Filled 2019-01-29: qty 1

## 2019-01-29 NOTE — ED Notes (Signed)
Pt stated she did not want to apply ice to affected ankle, just causes more pain.

## 2019-01-29 NOTE — Discharge Instructions (Addendum)
Wear walking boot. Elevate and apply ice for 20 minutes at a time. Follow up with orthopedics. Take Motrin/Tylenol as needed as prescribed for pain.
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
